# Patient Record
Sex: Female | Born: 1985 | Hispanic: Yes | Marital: Single | Smoking: Current some day smoker
Health system: Southern US, Community
[De-identification: ages and names within clinical notes are randomized; demographics above are authoritative.]

## PROBLEM LIST (undated history)

## (undated) DIAGNOSIS — Z789 Other specified health status: Secondary | ICD-10-CM

---

## 2007-06-09 HISTORY — PX: PLACEMENT OF BREAST IMPLANTS: SHX6334

## 2007-06-09 HISTORY — PX: OTHER SURGICAL HISTORY: SHX169

## 2017-01-05 ENCOUNTER — Other Ambulatory Visit: Payer: Self-pay

## 2017-01-13 ENCOUNTER — Encounter
Admission: RE | Admit: 2017-01-13 | Discharge: 2017-01-13 | Disposition: A | Discharge status: Home / Self Care | Payer: Medicaid Other | Source: Ambulatory Visit | Attending: Obstetrics and Gynecology | Admitting: Obstetrics and Gynecology

## 2017-01-13 DIAGNOSIS — F17200 Nicotine dependence, unspecified, uncomplicated: Secondary | ICD-10-CM | POA: Diagnosis not present

## 2017-01-13 DIAGNOSIS — Z9889 Other specified postprocedural states: Secondary | ICD-10-CM | POA: Diagnosis not present

## 2017-01-13 DIAGNOSIS — Z01812 Encounter for preprocedural laboratory examination: Secondary | ICD-10-CM | POA: Diagnosis not present

## 2017-01-13 DIAGNOSIS — Z79899 Other long term (current) drug therapy: Secondary | ICD-10-CM | POA: Insufficient documentation

## 2017-01-13 DIAGNOSIS — Z302 Encounter for sterilization: Secondary | ICD-10-CM | POA: Diagnosis not present

## 2017-01-13 DIAGNOSIS — Z825 Family history of asthma and other chronic lower respiratory diseases: Secondary | ICD-10-CM | POA: Insufficient documentation

## 2017-01-13 DIAGNOSIS — Z888 Allergy status to other drugs, medicaments and biological substances status: Secondary | ICD-10-CM | POA: Diagnosis not present

## 2017-01-13 DIAGNOSIS — Z8249 Family history of ischemic heart disease and other diseases of the circulatory system: Secondary | ICD-10-CM | POA: Diagnosis not present

## 2017-01-13 HISTORY — DX: Other specified health status: Z78.9

## 2017-01-13 LAB — CBC
HEMATOCRIT: 41.8 % (ref 35.0–47.0)
Hemoglobin: 14.4 g/dL (ref 12.0–16.0)
MCH: 32.3 pg (ref 26.0–34.0)
MCHC: 34.4 g/dL (ref 32.0–36.0)
MCV: 93.8 fL (ref 80.0–100.0)
Platelets: 263 10*3/uL (ref 150–440)
RBC: 4.46 MIL/uL (ref 3.80–5.20)
RDW: 13 % (ref 11.5–14.5)
WBC: 3.5 10*3/uL — AB (ref 3.6–11.0)

## 2017-01-13 LAB — BASIC METABOLIC PANEL
Anion gap: 7 (ref 5–15)
BUN: 15 mg/dL (ref 6–20)
CO2: 28 mmol/L (ref 22–32)
CREATININE: 0.98 mg/dL (ref 0.44–1.00)
Calcium: 9.7 mg/dL (ref 8.9–10.3)
Chloride: 104 mmol/L (ref 101–111)
GFR calc Af Amer: >60 mL/min (ref >60)
Glucose, Bld: 85 mg/dL (ref 65–99)
Potassium: 4.2 mmol/L (ref 3.5–5.1)
SODIUM: 139 mmol/L (ref 135–145)

## 2017-01-13 LAB — TYPE AND SCREEN
ABO/RH(D): O POS
ANTIBODY SCREEN: NEGATIVE

## 2017-01-13 NOTE — Patient Instructions (Signed)
  Your procedure is scheduled EA:VWUJWJon:Friday August 17 , 2018. Report to Same Day Surgery. To find out your arrival time please call [phone] between 1PM - 3PM on Thursday January 21, 2017.  Remember: Instructions that are not followed completely may result in serious medical risk, up to and including death, or upon the discretion of your surgeon and anesthesiologist your surgery may need to be rescheduled.    _x___ 1. Do not eat food or drink liquids after midnight. No gum chewing or hard candies.     _x___ 2. No Alcohol for 24 hours before or after surgery.   ____ 3. Bring all medications with you on the day of surgery if instructed.    __x__ 4. Notify your doctor if there is any change in your medical condition     (cold, fever, infections).    __x___ 5. No smoking 24 hours prior to surgery.     Do not wear jewelry, make-up, hairpins, clips or nail polish.  Do not wear lotions, powders, or perfumes.   Do not shave 48 hours prior to surgery. Men may shave face and neck.  Do not bring valuables to the hospital.    [facility] not responsible for any belongings or valuables.               Contacts, dentures or bridgework may not be worn into surgery.  Leave your suitcase in the car. After surgery it may be brought to your room.  For patients admitted to the hospital, discharge time is determined by your treatment team.   Patients discharged the day of surgery will not be allowed to drive home.    Please read over the following fact sheets that you were given:   [facility]  ____ Take these medicines the morning of surgery with A SIP OF WATER: NONE     ____ Fleet Enema (as directed)   _x___ Use CHG Soap as directed on instruction sheet  ____ Use inhalers on the day of surgery and bring to hospital day of surgery  ____ Stop metformin 2 days prior to surgery    ____ Take 1/2 of usual insulin dose the night before surgery and none on the morning of  surgery.   ____ Stop Coumadin/Plavix/aspirin on does not apply.  _x__ Stop Anti-inflammatories such as Advil, Aleve, Ibuprofen, Motrin, Naproxen,  Naprosyn, Goodies powders or aspirin  products. OK to take Tylenol.   ____ Stop supplements until after surgery.    ____ Bring C-Pap to the hospital.

## 2017-01-13 NOTE — H&P (Signed)
  H&P Date of Service: 01/13/2017 9:11 AM Donna Rodgers, Donna Austinhomas J, MD  Obstetrics    [] Hide copied text  [name] a 31 y.o.femalehere for Rf by Donna DoomsEmily Hedrick, NP-consult for BTL . PtG7P3 svd x 3 Desires permanent sterilization .  Was on the patch . Doesn't want IUD , Nexplanon .  Past Medical History:has no past medical history on file. Past Surgical History:has a past surgical history that includes breast implant; "tummy tuck"; and Augmentation mammaplasty. Family History:family history includes Asthma in her mother; High blood pressure (Hypertension) in her sister. Social History:reports that she has been smoking. She has never used smokeless tobacco. She reports that she drinks alcohol. She reports that she does not use drugs. OB/GYN History:         OB History   Gravida Para Term Preterm AB Living   5 3 3  2 3    SAB TAB Ectopic Molar Multiple Live Births    2    3      Allergies:is allergic to other. Medications:  Current Outpatient Prescriptions:  . diphenhydrAMINE (BENADRYL) 25 mg capsule, Take 25 mg by mouth every 6 (six) hours as needed for Itching., Disp: , Rfl:   Review of Systems: General: No fatigue or weightloss Eyes:No vision changes Ears:No hearing difficulty Respiratory:No cough or shortness of breath Pulmonary: No asthma or shortness of breath Cardiovascular:No chest pain, palpitations, dyspnea on exertion Gastrointestinal:No abdominal bloating, chronic diarrhea, constipations, masses, pain or hematochezia Genitourinary:No hematuria, dysuria, abnormal vaginal discharge, pelvic pain, Menometrorrhagia Lymphatic:No swollen lymph nodes Musculoskeletal:No muscle weakness Neurologic:No extremity weakness, syncope, seizure  disorder Psychiatric:No history of depression, delusions or suicidal/homicidal ideation   Exam:      Vitals:01/13/17  BP 102/63      Pulse: 74    Body mass index is 27.98 kg/m.  WDWN white/female in NAD  Lungs: CTA  CV: RRR without murmur   Neck: no thyromegaly Abdomen: soft , no mass, normal active bowel sounds, non-tender, no rebound tenderness, abdominoplasty scars noted  Pelvic: tanner stage 5 ,  External genitalia: vulva /labia no lesions Urethra: no prolapse Vagina: normal physiologic d/c Cervix: no lesions, no cervical motion tenderness  Uterus: normal size shape and contour, non-tender Adnexa:no mass, non-tender  Rectovaginal: no mass heme negative  Impression:   The encounter diagnosis was Encounter for sterilization.    Plan:   Benefits and risks to surgery:L/S BTL  The proposed benefit of the surgery has been discussed withthe patient. The possible risks include, butarenot limited WU:JWJXBto:organ injury to the bowel , bladder, ureters, and major blood vessels and nerves. There is a possibility of additional surgeries resulting from these injuries. There is also therisk of blood transfusion and the need to receive blood products during or after the procedure which may rarely lead to HIV or Hepatitis C infection. There is a risk of developing a deep venous thrombosis or a pulmonary embolism . There is the possibility of wound infection and also anesthetic complications, even the rare possibility of death. The patient understands these risks and wishes to proceed. All questions have been answered and the consent has been signed.      Electronically signed by Suzy BouchardSchermerhorn, Aneli Zara J, MD at 01/13/2017 12;01pm

## 2017-01-13 NOTE — H&P (Deleted)
Ms. Donna Rodgers is a 31 y.o. female here for Rf by Dr Tera Partridge BTL G2P2 s/p SVD x 2 .  Pt desires elective sterilization . Pt with no Abd/ pelvic surgery , no STP / PID . Separated , but is committed to not have additional children    Past Medical History:  has no past medical history on file.  Past Surgical History:  has no past surgical history on file. Family History: family history is not on file. Social History:  reports that she has never smoked. She has never used smokeless tobacco. She reports that she does not drink alcohol. OB/GYN History:          OB History    Gravida Para Term Preterm AB Living   2 2       2    SAB TAB Ectopic Molar Multiple Live Births             2      Allergies: has No Known Allergies. Medications: No current outpatient prescriptions on file.  Review of Systems: General:                      No fatigue or weight loss Eyes:                           No vision changes Ears:                            No hearing difficulty Respiratory:                No cough or shortness of breath Pulmonary:                  No asthma or shortness of breath Cardiovascular:           No chest pain, palpitations, dyspnea on exertion Gastrointestinal:          No abdominal bloating, chronic diarrhea, constipations, masses, pain or hematochezia Genitourinary:             No hematuria, dysuria, abnormal vaginal discharge, pelvic pain, Menometrorrhagia Lymphatic:                   No swollen lymph nodes Musculoskeletal:         No muscle weakness Neurologic:                  No extremity weakness, syncope, seizure disorder Psychiatric:                  No history of depression, delusions or suicidal/homicidal ideation    Exam:      Vitals:   11/30/16 1036  BP: (!) 129/93  Pulse: 109    Body mass index is 30.67 kg/m.  WDWN / black female in NAD   Lungs: CTA  CV : RRR without murmur   Neck:  no thyromegaly Abdomen: soft , no mass,  normal active bowel sounds,  non-tender, no rebound tenderness Pelvic: tanner stage 5 ,  External genitalia: vulva /labia no lesions Urethra: no prolapse Vagina: normal physiologic d/c Cervix: no lesions, no cervical motion tenderness   Uterus: normal size shape and contour, non-tender Adnexa: no mass,  non-tender   Rectovaginal:   Impression:   The encounter diagnosis was Encounter for sterilization.    Plan:   Benefits and risks to surgery: L/S BTL with falope rings  The proposed benefit of the surgery has been discussed with the patient. The possible risks include, but are not limited to: organ injury to the bowel , bladder, ureters, and major blood vessels and nerves. There is a possibility of additional surgeries resulting from these injuries. There is also the risk of blood transfusion and the need to receive blood products during or after the procedure which may rarely lead to HIV or Hepatitis C infection. There is a risk of developing a deep venous thrombosis or a pulmonary embolism . There is the possibility of wound infection and also anesthetic complications, even the rare possibility of death. The patient understands these risks and wishes to proceed. All questions have been answered and the consent has been signed. Failure rate 1:200 / yr    Donna PraderHOMAS JANSE SCHERMERHORN, MD

## 2017-01-13 NOTE — H&P (Signed)
Donna Rodgers is a 31 y.o. female here for Rf by [name], NP-consult for BTL . PtG7P3 svd x 3  Desires permanent sterilization .  Was on the patch . Doesn't want IUD , Nexplanon .  Past Medical History:  has no past medical history on file.  Past Surgical History:  has a past surgical history that includes breast implant; "tummy tuck"; and Augmentation mammaplasty. Family History: family history includes Asthma in her mother; High blood pressure (Hypertension) in her sister. Social History:  reports that she has been smoking.  She has never used smokeless tobacco. She reports that she drinks alcohol. She reports that she does not use drugs. OB/GYN History:          OB History    Gravida Para Term Preterm AB Living   5 3 3   2 3    SAB TAB Ectopic Molar Multiple Live Births     2       3      Allergies: is allergic to other. Medications:  Current Outpatient Prescriptions:  .  diphenhydrAMINE (BENADRYL) 25 mg capsule, Take 25 mg by mouth every 6 (six) hours as needed for Itching., Disp: , Rfl:   Review of Systems: General:                      No fatigue or weight loss Eyes:                           No vision changes Ears:                            No hearing difficulty Respiratory:                No cough or shortness of breath Pulmonary:                  No asthma or shortness of breath Cardiovascular:           No chest pain, palpitations, dyspnea on exertion Gastrointestinal:          No abdominal bloating, chronic diarrhea, constipations, masses, pain or hematochezia Genitourinary:             No hematuria, dysuria, abnormal vaginal discharge, pelvic pain, Menometrorrhagia Lymphatic:                   No swollen lymph nodes Musculoskeletal:         No muscle weakness Neurologic:                  No extremity weakness, syncope, seizure disorder Psychiatric:                  No history of depression, delusions or suicidal/homicidal ideation    Exam:     Vitals:   11/17/16 1416  BP: 102/78  Pulse: 74    Body mass index is 27.98 kg/m.  WDWN white/female in NAD   Lungs: CTA  CV : RRR without murmur    Neck:  no thyromegaly Abdomen: soft , no mass, normal active bowel sounds,  non-tender, no rebound tenderness, abdominoplasty scars noted  Pelvic: tanner stage 5 ,  External genitalia: vulva /labia no lesions Urethra: no prolapse Vagina: normal physiologic d/c Cervix: no lesions, no cervical motion tenderness   Uterus: normal size shape and contour, non-tender Adnexa: no mass,  non-tender  Rectovaginal: no mass heme negative  Impression:   The encounter diagnosis was Encounter for sterilization.    Plan:   Benefits and risks to surgery: L/S BTL  The proposed benefit of the surgery has been discussed with the patient. The possible risks include, but are not limited to: organ injury to the bowel , bladder, ureters, and major blood vessels and nerves. There is a possibility of additional surgeries resulting from these injuries. There is also the risk of blood transfusion and the need to receive blood products during or after the procedure which may rarely lead to HIV or Hepatitis C infection. There is a risk of developing a deep venous thrombosis or a pulmonary embolism . There is the possibility of wound infection and also anesthetic complications, even the rare possibility of death. The patient understands these risks and wishes to proceed. All questions have been answered and the consent has been signed.

## 2017-01-13 NOTE — OR Nursing (Signed)
Progress Notes - in this encounter  Schermerhorn, Sabas Soushomas Janse, MD - 11/17/2016 2:15 PM EDT Formatting of this note may be different from the original.  Chief Complaint:   Donna Rodgers is a 31 y.o. female here for Rf by [name], NP-consult for BTL . PtG7P3 svd x 3 Desires permanent sterilization .  Was on the patch . Doesn't want IUD , Nexplanon .  Past Medical History: has no past medical history on file.  Past Surgical History: has a past surgical history that includes breast implant; "tummy tuck"; and Augmentation mammaplasty. Family History: family history includes Asthma in her mother; High blood pressure (Hypertension) in her sister. Social History: reports that she has been smoking. She has never used smokeless tobacco. She reports that she drinks alcohol. She reports that she does not use drugs. OB/GYN History:  OB History  Gravida Para Term Preterm AB Living  5 3 3 2 3   SAB TAB Ectopic Molar Multiple Live Births  2 3    Allergies: is allergic to other. Medications:  Current Outpatient Prescriptions:  . diphenhydrAMINE (BENADRYL) 25 mg capsule, Take 25 mg by mouth every 6 (six) hours as needed for Itching., Disp: , Rfl:   Review of Systems: General: No fatigue or weight loss Eyes: No vision changes Ears: No hearing difficulty Respiratory: No cough or shortness of breath Pulmonary: No asthma or shortness of breath Cardiovascular: No chest pain, palpitations, dyspnea on exertion Gastrointestinal: No abdominal bloating, chronic diarrhea, constipations, masses, pain or hematochezia Genitourinary: No hematuria, dysuria, abnormal vaginal discharge, pelvic pain, Menometrorrhagia Lymphatic: No swollen lymph nodes Musculoskeletal: No muscle weakness Neurologic: No extremity weakness, syncope, seizure disorder Psychiatric: No history of depression, delusions or suicidal/homicidal ideation  Exam:   Vitals:  11/17/16 1416  BP: 102/78  Pulse: 74   Body mass index  is 27.98 kg/m.  WDWN white/female in NAD  Lungs: CTA  CV : RRR without murmur   Neck: no thyromegaly Abdomen: soft , no mass, normal active bowel sounds, non-tender, no rebound tenderness, abdominoplasty scars noted  Pelvic: tanner stage 5 ,  External genitalia: vulva /labia no lesions Urethra: no prolapse Vagina: normal physiologic d/c Cervix: no lesions, no cervical motion tenderness  Uterus: normal size shape and contour, non-tender Adnexa: no mass, non-tender  Rectovaginal: no mass heme negative  Impression:   The encounter diagnosis was Encounter for sterilization.  Plan:   Benefits and risks to surgery: L/S BTL  The proposed benefit of the surgery has been discussed with the patient. The possible risks include, but are not limited to: organ injury to the bowel , bladder, ureters, and major blood vessels and nerves. There is a possibility of additional surgeries resulting from these injuries. There is also the risk of blood transfusion and the need to receive blood products during or after the procedure which may rarely lead to HIV or Hepatitis C infection. There is a risk of developing a deep venous thrombosis or a pulmonary embolism . There is the possibility of wound infection and also anesthetic complications, even the rare possibility of death. The patient understands these risks and wishes to proceed. All questions have been answered and the consent has been signed.  No orders of the defined types were placed in this encounter.  Return if symptoms worsen or fail to improve, for preop.  Vilma PraderHOMAS JANSE SCHERMERHORN, MD

## 2017-01-22 ENCOUNTER — Ambulatory Visit: Payer: Medicaid Other | Admitting: Anesthesiology

## 2017-01-22 ENCOUNTER — Ambulatory Visit
Admission: RE | Admit: 2017-01-22 | Discharge: 2017-01-22 | Disposition: A | Discharge status: Home / Self Care | Payer: Medicaid Other | Source: Ambulatory Visit | Attending: Obstetrics and Gynecology | Admitting: Obstetrics and Gynecology

## 2017-01-22 ENCOUNTER — Encounter
Admission: RE | Disposition: A | Discharge status: Home / Self Care | Payer: Self-pay | Source: Ambulatory Visit | Attending: Obstetrics and Gynecology

## 2017-01-22 DIAGNOSIS — Z302 Encounter for sterilization: Secondary | ICD-10-CM | POA: Insufficient documentation

## 2017-01-22 DIAGNOSIS — Z825 Family history of asthma and other chronic lower respiratory diseases: Secondary | ICD-10-CM | POA: Insufficient documentation

## 2017-01-22 DIAGNOSIS — Z8249 Family history of ischemic heart disease and other diseases of the circulatory system: Secondary | ICD-10-CM | POA: Insufficient documentation

## 2017-01-22 DIAGNOSIS — F17200 Nicotine dependence, unspecified, uncomplicated: Secondary | ICD-10-CM | POA: Insufficient documentation

## 2017-01-22 DIAGNOSIS — Z9889 Other specified postprocedural states: Secondary | ICD-10-CM | POA: Diagnosis not present

## 2017-01-22 DIAGNOSIS — Z9882 Breast implant status: Secondary | ICD-10-CM | POA: Diagnosis not present

## 2017-01-22 HISTORY — PX: LAPAROSCOPIC TUBAL LIGATION: SHX1937

## 2017-01-22 LAB — POCT PREGNANCY, URINE
PREG TEST UR: NEGATIVE
Preg Test, Ur: NEGATIVE

## 2017-01-22 LAB — ABO/RH: ABO/RH(D): O POS

## 2017-01-22 SURGERY — LIGATION, FALLOPIAN TUBE, LAPAROSCOPIC
Anesthesia: General | Laterality: Bilateral

## 2017-01-22 MED ORDER — MIDAZOLAM HCL 2 MG/2ML IJ SOLN
INTRAMUSCULAR | Status: AC
  Filled 2017-01-22: qty 2

## 2017-01-22 MED ORDER — KETOROLAC TROMETHAMINE 30 MG/ML IJ SOLN
INTRAMUSCULAR | Status: DC | PRN
  Administered 2017-01-22: 30 mg via INTRAVENOUS

## 2017-01-22 MED ORDER — HYDROCODONE-ACETAMINOPHEN 5-325 MG PO TABS
1.0000 | ORAL_TABLET | Freq: Once | ORAL | Status: AC
  Administered 2017-01-22: 1 via ORAL

## 2017-01-22 MED ORDER — ROCURONIUM BROMIDE 100 MG/10ML IV SOLN
INTRAVENOUS | Status: DC | PRN
  Administered 2017-01-22: 30 mg via INTRAVENOUS

## 2017-01-22 MED ORDER — FAMOTIDINE 20 MG PO TABS
20.0000 mg | ORAL_TABLET | Freq: Once | ORAL | Status: AC
  Administered 2017-01-22: 20 mg via ORAL

## 2017-01-22 MED ORDER — DEXAMETHASONE SODIUM PHOSPHATE 10 MG/ML IJ SOLN
INTRAMUSCULAR | Status: DC | PRN
  Administered 2017-01-22: 10 mg via INTRAVENOUS

## 2017-01-22 MED ORDER — ONDANSETRON HCL 4 MG/2ML IJ SOLN
INTRAMUSCULAR | Status: DC | PRN
  Administered 2017-01-22: 4 mg via INTRAVENOUS

## 2017-01-22 MED ORDER — FAMOTIDINE 20 MG PO TABS
ORAL_TABLET | ORAL | Status: AC
  Filled 2017-01-22: qty 1

## 2017-01-22 MED ORDER — HYDROCODONE-ACETAMINOPHEN 5-325 MG PO TABS
ORAL_TABLET | ORAL | Status: AC
  Filled 2017-01-22: qty 1

## 2017-01-22 MED ORDER — SUGAMMADEX SODIUM 200 MG/2ML IV SOLN
INTRAVENOUS | Status: DC | PRN
  Administered 2017-01-22: 200 mg via INTRAVENOUS

## 2017-01-22 MED ORDER — BUPIVACAINE HCL (PF) 0.5 % IJ SOLN
INTRAMUSCULAR | Status: AC
  Filled 2017-01-22: qty 30

## 2017-01-22 MED ORDER — BUPIVACAINE HCL 0.5 % IJ SOLN
INTRAMUSCULAR | Status: DC | PRN
  Administered 2017-01-22: 10 mL

## 2017-01-22 MED ORDER — SILVER NITRATE-POT NITRATE 75-25 % EX MISC
CUTANEOUS | Status: AC
  Filled 2017-01-22: qty 4

## 2017-01-22 MED ORDER — MIDAZOLAM HCL 2 MG/2ML IJ SOLN
INTRAMUSCULAR | Status: DC | PRN
  Administered 2017-01-22: 2 mg via INTRAVENOUS

## 2017-01-22 MED ORDER — FENTANYL CITRATE (PF) 100 MCG/2ML IJ SOLN
INTRAMUSCULAR | Status: AC
  Filled 2017-01-22: qty 2

## 2017-01-22 MED ORDER — LACTATED RINGERS IV SOLN
INTRAVENOUS | Status: DC
  Administered 2017-01-22 (×2): via INTRAVENOUS

## 2017-01-22 MED ORDER — PROPOFOL 10 MG/ML IV BOLUS
INTRAVENOUS | Status: DC | PRN
  Administered 2017-01-22: 160 mg via INTRAVENOUS

## 2017-01-22 MED ORDER — FENTANYL CITRATE (PF) 100 MCG/2ML IJ SOLN
25.0000 ug | INTRAMUSCULAR | Status: AC | PRN
  Administered 2017-01-22 (×6): 25 ug via INTRAVENOUS

## 2017-01-22 MED ORDER — LIDOCAINE HCL (CARDIAC) 20 MG/ML IV SOLN
INTRAVENOUS | Status: DC | PRN
  Administered 2017-01-22: 100 mg via INTRAVENOUS

## 2017-01-22 MED ORDER — FENTANYL CITRATE (PF) 100 MCG/2ML IJ SOLN
INTRAMUSCULAR | Status: DC | PRN
  Administered 2017-01-22 (×2): 50 ug via INTRAVENOUS

## 2017-01-22 MED ORDER — ONDANSETRON HCL 4 MG/2ML IJ SOLN: 4.0000 mg | Freq: Once | INTRAMUSCULAR | Status: DC | PRN

## 2017-01-22 MED ORDER — FENTANYL CITRATE (PF) 100 MCG/2ML IJ SOLN
INTRAMUSCULAR | Status: AC
  Administered 2017-01-22: 25 ug via INTRAVENOUS
  Filled 2017-01-22: qty 2

## 2017-01-22 SURGICAL SUPPLY — 27 items
BLADE SURG SZ11 CARB STEEL (BLADE) ×3 IMPLANT
CATH ROBINSON RED A/P 16FR (CATHETERS) ×3 IMPLANT
CLOSURE WOUND 1/2 X4 (GAUZE/BANDAGES/DRESSINGS)
CLOSURE WOUND 1/4X4 (GAUZE/BANDAGES/DRESSINGS)
GLOVE BIO SURGEON STRL SZ8 (GLOVE) ×3 IMPLANT
GOWN STRL REUS W/ TWL LRG LVL3 (GOWN DISPOSABLE) ×1 IMPLANT
GOWN STRL REUS W/ TWL XL LVL3 (GOWN DISPOSABLE) ×1 IMPLANT
GOWN STRL REUS W/TWL LRG LVL3 (GOWN DISPOSABLE) ×2
GOWN STRL REUS W/TWL XL LVL3 (GOWN DISPOSABLE) ×2
KIT DISPOSABLE FALLOPE RING (Ring) ×3 IMPLANT
KIT RM TURNOVER CYSTO AR (KITS) ×3 IMPLANT
LABEL OR SOLS (LABEL) ×3 IMPLANT
NS IRRIG 500ML POUR BTL (IV SOLUTION) ×3 IMPLANT
PACK GYN LAPAROSCOPIC (MISCELLANEOUS) ×3 IMPLANT
PAD OB MATERNITY 4.3X12.25 (PERSONAL CARE ITEMS) ×3 IMPLANT
PAD PREP 24X41 OB/GYN DISP (PERSONAL CARE ITEMS) ×3 IMPLANT
SHEARS HARMONIC ACE PLUS 36CM (ENDOMECHANICALS) IMPLANT
STRIP CLOSURE SKIN 1/2X4 (GAUZE/BANDAGES/DRESSINGS) IMPLANT
STRIP CLOSURE SKIN 1/4X4 (GAUZE/BANDAGES/DRESSINGS) IMPLANT
SUT VIC AB 0 CT1 36 (SUTURE) IMPLANT
SUT VIC AB 2-0 UR6 27 (SUTURE) IMPLANT
SUT VIC AB 4-0 SH 27 (SUTURE) ×2
SUT VIC AB 4-0 SH 27XANBCTRL (SUTURE) ×1 IMPLANT
SWABSTK COMLB BENZOIN TINCTURE (MISCELLANEOUS) ×3 IMPLANT
TROCAR ENDO BLADELESS 11MM (ENDOMECHANICALS) IMPLANT
TROCAR XCEL NON-BLD 5MMX100MML (ENDOMECHANICALS) ×3 IMPLANT
TUBING INSUFFLATOR HI FLOW (MISCELLANEOUS) ×3 IMPLANT

## 2017-01-22 NOTE — Anesthesia Postprocedure Evaluation (Signed)
Anesthesia Post Note  Patient: Donna Rodgers  Procedure(s) Performed: Procedure(s) (LRB): LAPAROSCOPIC TUBAL LIGATION (Bilateral)  Patient location during evaluation: PACU Anesthesia Type: General Level of consciousness: awake and alert Pain management: pain level controlled Vital Signs Assessment: post-procedure vital signs reviewed and stable Respiratory status: spontaneous breathing and respiratory function stable Cardiovascular status: stable Anesthetic complications: no     Last Vitals:  Vitals:   01/22/17 1020 01/22/17 1025  BP:    Pulse: 96 87  Resp: 11 12  Temp:    SpO2: 97% 100%    Last Pain:  Vitals:   01/22/17 1025  TempSrc:   PainSc: 5                  KEPHART,WILLIAM K

## 2017-01-22 NOTE — Brief Op Note (Signed)
01/22/2017  9:58 AM  PATIENT:  Donna Rodgers  31 y.o. female  PRE-OPERATIVE DIAGNOSIS:  Elective Sterilization  POST-OPERATIVE DIAGNOSIS:  Elective Sterilization  PROCEDURE:  Procedure(s): LAPAROSCOPIC TUBAL LIGATION (Bilateral)  SURGEON:  Surgeon(s) and Role:    * Janeisha Ryle, Ihor Austin, MD - Primary  PHYSICIAN ASSISTANT:   ASSISTANTS: none   ANESTHESIA:   general  EBL:  Total I/O In: 800 [I.V.:800] Out: 152 [Urine:150; Blood:2]  BLOOD ADMINISTERED:none  DRAINS: none   LOCAL MEDICATIONS USED:  MARCAINE     SPECIMEN:  No Specimen  DISPOSITION OF SPECIMEN:  N/A  COUNTS:  YES  TOURNIQUET:  * No tourniquets in log *  DICTATION: .Other Dictation: Dictation Number verbal  PLAN OF CARE: Discharge to home after PACU  PATIENT DISPOSITION:  PACU - hemodynamically stable.   Delay start of Pharmacological VTE agent (>24hrs) due to surgical blood loss or risk of bleeding: not applicable

## 2017-01-22 NOTE — Progress Notes (Signed)
Pt states pain is a little better and wants to go home

## 2017-01-22 NOTE — Anesthesia Post-op Follow-up Note (Signed)
Anesthesia QCDR form completed.        

## 2017-01-22 NOTE — Progress Notes (Signed)
Pt ready for BTL  Via L/S . NPO / all questions answered  Proceed neg HCG

## 2017-01-22 NOTE — Anesthesia Preprocedure Evaluation (Signed)
Anesthesia Evaluation  Patient identified by MRN, date of birth, ID band Patient awake    Reviewed: Allergy & Precautions, NPO status , Patient's Chart, lab work & pertinent test results  History of Anesthesia Complications Negative for: history of anesthetic complications  Airway Mallampati: II       Dental   Pulmonary neg COPD, Current Smoker,           Cardiovascular (-) hypertension(-) Past MI and (-) CHF (-) dysrhythmias (-) Valvular Problems/Murmurs     Neuro/Psych neg Seizures    GI/Hepatic Neg liver ROS, neg GERD  ,  Endo/Other  neg diabetes  Renal/GU negative Renal ROS     Musculoskeletal   Abdominal   Peds  Hematology   Anesthesia Other Findings   Reproductive/Obstetrics                             Anesthesia Physical Anesthesia Plan  ASA: II  Anesthesia Plan: General   Post-op Pain Management:    Induction: Intravenous  PONV Risk Score and Plan: 2 and Ondansetron and Dexamethasone  Airway Management Planned: Oral ETT  Additional Equipment:   Intra-op Plan:   Post-operative Plan:   Informed Consent: I have reviewed the patients History and Physical, chart, labs and discussed the procedure including the risks, benefits and alternatives for the proposed anesthesia with the patient or authorized representative who has indicated his/her understanding and acceptance.     Plan Discussed with:   Anesthesia Plan Comments:         Anesthesia Quick Evaluation

## 2017-01-22 NOTE — Transfer of Care (Signed)
Immediate Anesthesia Transfer of Care Note  Patient: Donna Rodgers  Procedure(s) Performed: Procedure(s): LAPAROSCOPIC TUBAL LIGATION (Bilateral)  Patient Location: PACU  Anesthesia Type:General  Level of Consciousness: sedated  Airway & Oxygen Therapy: Patient Spontanous Breathing and Patient connected to face mask oxygen  Post-op Assessment: Report given to RN and Post -op Vital signs reviewed and stable  Post vital signs: Reviewed and stable  Last Vitals:  Vitals:   01/22/17 0802  BP: 118/77  Pulse: 86  Resp: 16  Temp: (!) 35.9 C  SpO2: 100%    Last Pain:  Vitals:   01/22/17 0802  TempSrc: Tympanic         Complications: No apparent anesthesia complications

## 2017-01-22 NOTE — Anesthesia Procedure Notes (Signed)
Procedure Name: Intubation Date/Time: 01/22/2017 9:15 AM Performed by: Justus Memory Pre-anesthesia Checklist: Patient identified, Patient being monitored, Timeout performed, Emergency Drugs available and Suction available Patient Re-evaluated:Patient Re-evaluated prior to induction Oxygen Delivery Method: Circle system utilized Preoxygenation: Pre-oxygenation with 100% oxygen Induction Type: IV induction Ventilation: Mask ventilation without difficulty Laryngoscope Size: Mac and 3 Grade View: Grade I Tube type: Oral Tube size: 7.0 mm Number of attempts: 1 Airway Equipment and Method: Stylet Placement Confirmation: ETT inserted through vocal cords under direct vision,  positive ETCO2 and breath sounds checked- equal and bilateral Secured at: 21 cm Tube secured with: Tape Dental Injury: Teeth and Oropharynx as per pre-operative assessment

## 2017-01-25 NOTE — Op Note (Signed)
NAME:  Donna Rodgers, MEGEE NO.:  [id number]  MEDICAL RECORD NO.:  [id number]  LOCATION:                                 FACILITY:  PHYSICIAN:  Jennell Corner, MD     DATE OF BIRTH:  DATE OF PROCEDURE:  01/22/2017 DATE OF DISCHARGE:                              OPERATIVE REPORT   PREOPERATIVE DIAGNOSIS:  Elective permanent sterilization.  POSTOPERATIVE DIAGNOSIS:  Elective permanent sterilization.  PROCEDURE PERFORMED:  Laparoscopic bilateral tubal ligation, Falope rings.  SURGEON:  Jennell Corner, MD  ANESTHESIA:  General endotracheal anesthesia.  SURGEON:  Jennell Corner, MD.  INDICATION:  A 31 year old, gravida 5, para 3 patient, who has elected for permanent sterilization.  DESCRIPTION OF PROCEDURE:  After adequate general endotracheal anesthesia, the patient was placed in dorsal supine position.  The patient's abdomen, perineum, and vagina were prepped and draped in normal sterile fashion.  Time-out was performed.  Straight catheterization of the bladder yielded 150 mL clear urine.  Sponge stick was placed in the vagina and used for uterine manipulation during the procedure.  Gloves were changed.  A 7 mm infraumbilical incision was made after injected with 0.5% Marcaine.  The 5 mm laparoscope was advanced into the abdominal cavity under direct visualization with the Optiview cannula.  Once gaining entrance into the peritoneal cavity, the patient's abdomen was insufflated with carbon dioxide.  A second port was placed 2 cm above the symphysis pubis centrally after injected with 0.5% Marcaine.  The 7 mm Falope ring trocar was advanced under direct visualization.  Attention was directed to the patient's right fallopian tube, which was identified.  The fimbria was identified as well.  The Falope ring was applied at the isthmic ampullary portion of the fallopian tube and with resulting 1 cm knuckle of fallopian tube. Similar procedure was  repeated on the patient's left fallopian tube. The Falope ring was applied at the isthmic ampullary portion and a 1 cm knuckle of fallopian tube resulted.  Pictures were taken.  Good hemostasis was noted.  The patient's abdomen was deflated and the cannulas were removed.  Each incision was closed with interrupted 4-0 Vicryl suture.  Sterile dressing applied.  Vaginal sponge was removed as well.  There were no complications.  ESTIMATED BLOOD LOSS:  Minimal.  INTRAOPERATIVE FLUIDS:  800 mL.  URINE OUTPUT:  150 mL.  The patient was taken to recovery room in good condition.    ______________________________ Jennell Corner, MD   ______________________________ Jennell Corner, MD    TS/MEDQ  D:  01/22/2017  T:  01/22/2017  Job:  [id number]

## 2017-01-26 ENCOUNTER — Emergency Department: Payer: Self-pay

## 2017-01-26 ENCOUNTER — Emergency Department
Admission: EM | Admit: 2017-01-26 | Discharge: 2017-01-26 | Disposition: A | Discharge status: Home / Self Care | Payer: Self-pay | Attending: Emergency Medicine | Admitting: Emergency Medicine

## 2017-01-26 ENCOUNTER — Encounter: Payer: Self-pay | Admitting: Emergency Medicine

## 2017-01-26 DIAGNOSIS — Z79899 Other long term (current) drug therapy: Secondary | ICD-10-CM | POA: Insufficient documentation

## 2017-01-26 DIAGNOSIS — R0781 Pleurodynia: Secondary | ICD-10-CM | POA: Insufficient documentation

## 2017-01-26 DIAGNOSIS — F17210 Nicotine dependence, cigarettes, uncomplicated: Secondary | ICD-10-CM | POA: Insufficient documentation

## 2017-01-26 DIAGNOSIS — G8918 Other acute postprocedural pain: Secondary | ICD-10-CM | POA: Insufficient documentation

## 2017-01-26 LAB — TROPONIN I

## 2017-01-26 LAB — CBC
HCT: 43.4 % (ref 35.0–47.0)
Hemoglobin: 15 g/dL (ref 12.0–16.0)
MCH: 31.8 pg (ref 26.0–34.0)
MCHC: 34.6 g/dL (ref 32.0–36.0)
MCV: 91.7 fL (ref 80.0–100.0)
PLATELETS: 262 10*3/uL (ref 150–440)
RBC: 4.73 MIL/uL (ref 3.80–5.20)
RDW: 12.8 % (ref 11.5–14.5)
WBC: 6.1 10*3/uL (ref 3.6–11.0)

## 2017-01-26 LAB — BASIC METABOLIC PANEL
Anion gap: 8 (ref 5–15)
BUN: 10 mg/dL (ref 6–20)
CALCIUM: 9.9 mg/dL (ref 8.9–10.3)
CHLORIDE: 103 mmol/L (ref 101–111)
CO2: 27 mmol/L (ref 22–32)
CREATININE: 0.7 mg/dL (ref 0.44–1.00)
GFR calc non Af Amer: >60 mL/min (ref >60)
Glucose, Bld: 95 mg/dL (ref 65–99)
Potassium: 3.9 mmol/L (ref 3.5–5.1)
SODIUM: 138 mmol/L (ref 135–145)

## 2017-01-26 MED ORDER — IOPAMIDOL (ISOVUE-370) INJECTION 76%
75.0000 mL | Freq: Once | INTRAVENOUS | Status: AC | PRN
  Administered 2017-01-26: 75 mL via INTRAVENOUS

## 2017-01-26 NOTE — ED Provider Notes (Signed)
[facility] Emergency Department Provider Note  ____________________________________________  Time seen: Approximately 6:16 PM  I have reviewed the triage vital signs and the nursing notes.   HISTORY  Chief Complaint Chest Pain    HPI Donna Rodgers is a 31 y.o. female who had laparoscopic BTL 4 days ago, and 3 days ago had onset of pleuritic central chest pain, occasionally radiating through to her back. Associated with shortness of breath. Worse with breathing. Not exertional. No cough fever or chills. Occasional diaphoresis. No vomiting. No alleviating factors. Moderate intensity. Sharp. Never had anything like this before.     Past Medical History:  Diagnosis Date  . Medical history non-contributory   None   There are no active problems to display for this patient.    Past Surgical History:  Procedure Laterality Date  . LAPAROSCOPIC TUBAL LIGATION Bilateral 01/22/2017   Procedure: LAPAROSCOPIC TUBAL LIGATION;  Surgeon: Schermerhorn, Ihor Austin, MD;  Location: [facility] ORS;  Service: Gynecology;  Laterality: Bilateral;  . PLACEMENT OF BREAST IMPLANTS Bilateral 2009  . tummy tuck  2009     Prior to Admission medications   Medication Sig Start Date End Date Taking? Authorizing Provider  cetirizine (ZYRTEC) 10 MG tablet Take 10 mg by mouth daily as needed for allergies.    [provider]     Allergies Patient has no known allergies.   No family history on file.  Social History Social History  Substance Use Topics  . Smoking status: Current Some Day Smoker  . Smokeless tobacco: Never Used     Comment: Hookah every day  . Alcohol use No     Comment: 1 mixed drink per month    Review of Systems  Constitutional:   No fever or chills.  ENT:   No sore throat. No rhinorrhea. Cardiovascular:   Positive as above chest pain without syncope. Respiratory:   Positive shortness of breath without cough. Gastrointestinal:   Positive mild  generalized abdominal pain, improving. No vomiting diarrhea or constipation.  Musculoskeletal:   Negative for focal pain or swelling All other systems reviewed and are negative except as documented above in ROS and HPI.  ____________________________________________   PHYSICAL EXAM:  VITAL SIGNS: ED Triage Vitals  Enc Vitals Group     BP 01/26/17 1244 113/74     Pulse Rate 01/26/17 1244 87     Resp 01/26/17 1244 16     Temp 01/26/17 1244 98.7 F (37.1 C)     Temp Source 01/26/17 1244 Oral     SpO2 01/26/17 1244 99 %     Weight 01/26/17 1244 160 lb (72.6 kg)     Height 01/26/17 1244 5\' 4"  (1.626 m)     Head Circumference --      Peak Flow --      Pain Score 01/26/17 1547 7     Pain Loc --      Pain Edu? --      Excl. in GC? --     Vital signs reviewed, nursing assessments reviewed.   Constitutional:   Alert and oriented. Well appearing and in no distress. Eyes:   No scleral icterus.  EOMI. No nystagmus. No conjunctival pallor. PERRL. ENT   Head:   Normocephalic and atraumatic.   Nose:   No congestion/rhinnorhea.    Mouth/Throat:   MMM, no pharyngeal erythema. No peritonsillar mass.    Neck:   No meningismus. Full ROM Hematological/Lymphatic/Immunilogical:   No cervical lymphadenopathy. Cardiovascular:   RRR. Symmetric  bilateral radial and DP pulses.  No murmurs.  Respiratory:   Normal respiratory effort without tachypnea/retractions. Breath sounds are clear and equal bilaterally. No wheezes/rales/rhonchi. Gastrointestinal:   Soft and nontender. Non distended. There is no CVA tenderness.  No rebound, rigidity, or guarding. Genitourinary:   deferred Musculoskeletal:   Normal range of motion in all extremities. No joint effusions.  No lower extremity tenderness.  No edema.Chest wall nontender. Neurologic:   Normal speech and language.  Motor grossly intact. No gross focal neurologic deficits are appreciated.  Skin:    Skin is warm, dry and intact. Laparoscopic  surgical incision at the umbilicus and suprapubic area well healed.  ____________________________________________    LABS (pertinent positives/negatives) (all labs ordered are listed, but only abnormal results are displayed) Labs Reviewed  BASIC METABOLIC PANEL  CBC  TROPONIN I   ____________________________________________   EKG  Interpreted by me Normal sinus rhythm rate of 70, normal axis and intervals. Normal QRS ST segments and T waves. No evidence of right heart strain.  ____________________________________________    RADIOLOGY  Dg Chest 2 View  Result Date: 01/26/2017 CLINICAL DATA:  Chest pain, shortness of breath EXAM: CHEST  2 VIEW COMPARISON:  None. FINDINGS: There is no focal parenchymal opacity. There is no pleural effusion or pneumothorax. The heart and mediastinal contours are unremarkable. There is a linear lucency under the left hemidiaphragm. The osseous structures are unremarkable. IMPRESSION: Linear lucency under the left hemidiaphragm likely postsurgical given recent tubal ligation on 01/22/2017. No active cardiopulmonary disease. These results were called by telephone at the time of interpretation on 01/26/2017 at 1:46 pm to Dr. Daryel November , who verbally acknowledged these results. Electronically Signed   By: Elige Ko   On: 01/26/2017 13:47   Ct Angio Chest Pe W And/or Wo Contrast  Result Date: 01/26/2017 CLINICAL DATA:  Recent laparoscopic tubal ligation. Chest pain and shortness of breath EXAM: CT ANGIOGRAPHY CHEST WITH CONTRAST TECHNIQUE: Multidetector CT imaging of the chest was performed using the standard protocol during bolus administration of intravenous contrast. Multiplanar CT image reconstructions and MIPs were obtained to evaluate the vascular anatomy. CONTRAST:  75 mL Isovue 370 nonionic COMPARISON:  Chest radiograph January 26, 2017 FINDINGS: Cardiovascular: There is no demonstrable pulmonary embolus. There is no thoracic aortic aneurysm or  dissection. Visualized great vessels appear normal. Pericardium is not appreciably thickened. Mediastinum/Nodes: Thyroid appears unremarkable. There is no appreciable thoracic adenopathy. There is mild air in the distal esophagus. There does not appear to be thickening of the esophageal wall. Lungs/Pleura: Lungs are clear. No pleural effusion or pleural thickening. Upper Abdomen: In the visualized upper abdomen, there is slight reflux of contrast into the inferior vena cava. This contrast does not extend into the hepatic veins. There are foci of pneumoperitoneum. Visualized upper abdominal structures otherwise appear unremarkable. Musculoskeletal: There are breast implants bilaterally. There are no blastic or lytic bone lesions. Review of the MIP images confirms the above findings. IMPRESSION: 1.  No demonstrable pulmonary embolus. 2.  Lungs clear. 3. Pneumoperitoneum. This pneumoperitoneum is most likely of postoperative etiology. Patient should be watched closely clinically in this regard given that this role perforation cannot be entirely excluded in this circumstance. 4.  No appreciable adenopathy. 5. Modest reflux of contrast into the inferior vena cava. Significance of this modest reflux uncertain. It potentially could indicate a degree of increased right heart pressure. Electronically Signed   By: Bretta Bang III M.D.   On: 01/26/2017 17:44    ____________________________________________  PROCEDURES Procedures  ____________________________________________   INITIAL IMPRESSION / ASSESSMENT AND PLAN / ED COURSE  Pertinent labs & imaging results that were available during my care of the patient were reviewed by me and considered in my medical decision making (see chart for details).  Patient presents with pleuritic chest pain, postoperative related to pneumoperitoneum and diaphragmatic irritation and mechanics of intubation versus PE. Labs unremarkable, CT angiogram negative. Vital signs  normal. Patient not in distress with normal work of breathing. Discharge home to follow up with primary care and surgery as needed.Considering the patient's symptoms, medical history, and physical examination today, I have low suspicion for ACS, PE, TAD, pneumothorax, carditis, mediastinitis, pneumonia, CHF, or sepsis.        ____________________________________________   FINAL CLINICAL IMPRESSION(S) / ED DIAGNOSES  Final diagnoses:  Pleuritic chest pain  Post-operative pain      New Prescriptions   No medications on file     Portions of this note were generated with dragon dictation software. Dictation errors may occur despite best attempts at proofreading.    Sharman Cheek, MD 01/26/17 Zollie Pee

## 2017-01-26 NOTE — Discharge Instructions (Signed)
Your tests today were unremarkable. You do not have a blood clot in your lungs.    Results for orders placed or performed during the hospital encounter of 01/26/17  Basic metabolic panel  Result Value Ref Range   Sodium 138 135 - 145 mmol/L   Potassium 3.9 3.5 - 5.1 mmol/L   Chloride 103 101 - 111 mmol/L   CO2 27 22 - 32 mmol/L   Glucose, Bld 95 65 - 99 mg/dL   BUN 10 6 - 20 mg/dL   Creatinine, Ser 1.77 0.44 - 1.00 mg/dL   Calcium 9.9 8.9 - 93.9 mg/dL   GFR calc non Af Amer >60 >60 mL/min   GFR calc Af Amer >60 >60 mL/min   Anion gap 8 5 - 15  CBC  Result Value Ref Range   WBC 6.1 3.6 - 11.0 K/uL   RBC 4.73 3.80 - 5.20 MIL/uL   Hemoglobin 15.0 12.0 - 16.0 g/dL   HCT 03.0 09.2 - 33.0 %   MCV 91.7 80.0 - 100.0 fL   MCH 31.8 26.0 - 34.0 pg   MCHC 34.6 32.0 - 36.0 g/dL   RDW 07.6 22.6 - 33.3 %   Platelets 262 150 - 440 K/uL  Troponin I  Result Value Ref Range   Troponin I <0.03 <0.03 ng/mL   Dg Chest 2 View  Result Date: 01/26/2017 CLINICAL DATA:  Chest pain, shortness of breath EXAM: CHEST  2 VIEW COMPARISON:  None. FINDINGS: There is no focal parenchymal opacity. There is no pleural effusion or pneumothorax. The heart and mediastinal contours are unremarkable. There is a linear lucency under the left hemidiaphragm. The osseous structures are unremarkable. IMPRESSION: Linear lucency under the left hemidiaphragm likely postsurgical given recent tubal ligation on 01/22/2017. No active cardiopulmonary disease. These results were called by telephone at the time of interpretation on 01/26/2017 at 1:46 pm to Dr. Daryel November , who verbally acknowledged these results. Electronically Signed   By: Elige Ko   On: 01/26/2017 13:47   Ct Angio Chest Pe W And/or Wo Contrast  Result Date: 01/26/2017 CLINICAL DATA:  Recent laparoscopic tubal ligation. Chest pain and shortness of breath EXAM: CT ANGIOGRAPHY CHEST WITH CONTRAST TECHNIQUE: Multidetector CT imaging of the chest was performed  using the standard protocol during bolus administration of intravenous contrast. Multiplanar CT image reconstructions and MIPs were obtained to evaluate the vascular anatomy. CONTRAST:  75 mL Isovue 370 nonionic COMPARISON:  Chest radiograph January 26, 2017 FINDINGS: Cardiovascular: There is no demonstrable pulmonary embolus. There is no thoracic aortic aneurysm or dissection. Visualized great vessels appear normal. Pericardium is not appreciably thickened. Mediastinum/Nodes: Thyroid appears unremarkable. There is no appreciable thoracic adenopathy. There is mild air in the distal esophagus. There does not appear to be thickening of the esophageal wall. Lungs/Pleura: Lungs are clear. No pleural effusion or pleural thickening. Upper Abdomen: In the visualized upper abdomen, there is slight reflux of contrast into the inferior vena cava. This contrast does not extend into the hepatic veins. There are foci of pneumoperitoneum. Visualized upper abdominal structures otherwise appear unremarkable. Musculoskeletal: There are breast implants bilaterally. There are no blastic or lytic bone lesions. Review of the MIP images confirms the above findings. IMPRESSION: 1.  No demonstrable pulmonary embolus. 2.  Lungs clear. 3. Pneumoperitoneum. This pneumoperitoneum is most likely of postoperative etiology. Patient should be watched closely clinically in this regard given that this role perforation cannot be entirely excluded in this circumstance. 4.  No appreciable adenopathy. 5.  Modest reflux of contrast into the inferior vena cava. Significance of this modest reflux uncertain. It potentially could indicate a degree of increased right heart pressure. Electronically Signed   By: Bretta Bang III M.D.   On: 01/26/2017 17:44

## 2017-01-26 NOTE — ED Triage Notes (Signed)
Pt to ED via pov with c/o CP and SOB that started after her tubal ligation on 8/17. Pt A&OX4, NAD noted. MD Williams eval ekg and protocols only at this time.

## 2019-08-21 IMAGING — CT CT ANGIO CHEST
2 of 6 series · 18 of 46 positions shown · IV contrast (APPLIED)
Comparison: Chest radiograph January 26, 2017

CLINICAL DATA: Recent laparoscopic tubal ligation. Chest pain and
shortness of breath

EXAM:
CT ANGIOGRAPHY CHEST WITH CONTRAST
TECHNIQUE: Multidetector CT imaging of the chest was performed using the
standard protocol during bolus administration of intravenous
contrast. Multiplanar CT image reconstructions and MIPs were
obtained to evaluate the vascular anatomy.
CONTRAST:  75 mL Isovue 370 nonionic

[Series 5: thins · axial · 0.64mm/px · z∈[-264,-32]mm · 16 of 256 slices shown]
[im 12/256  lung]
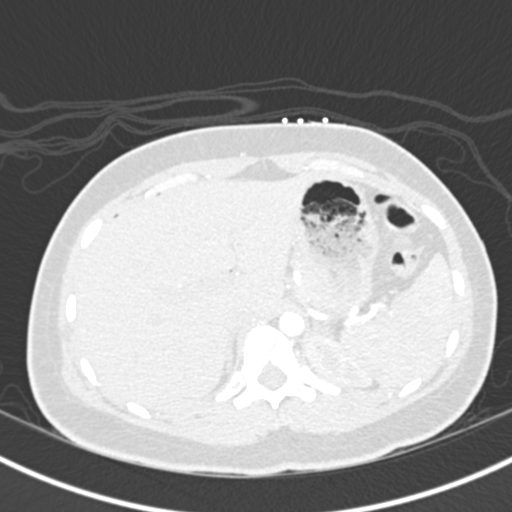
[im 34/256  soft-tissue]
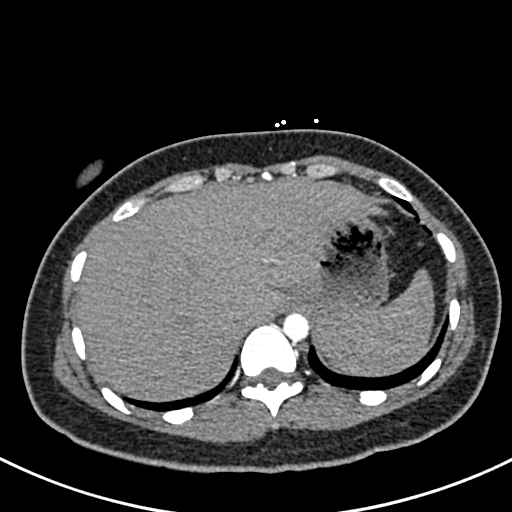
[im 45/256  lung]
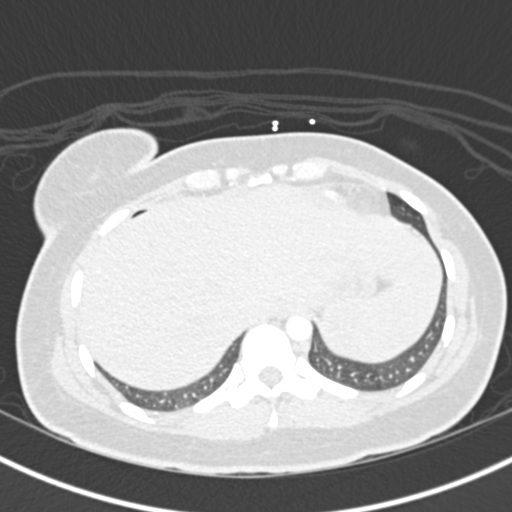
[im 56/256  soft-tissue]
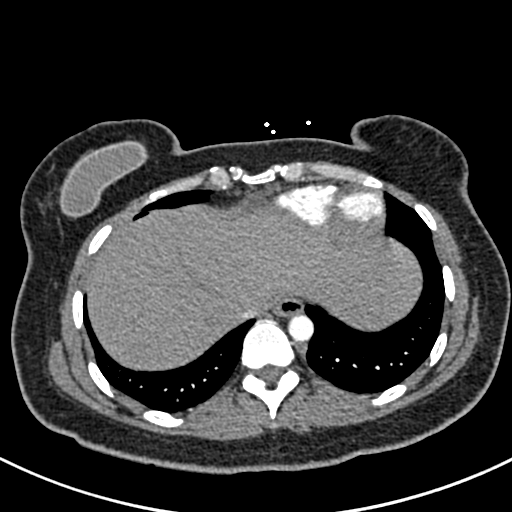
[im 78/256  lung]
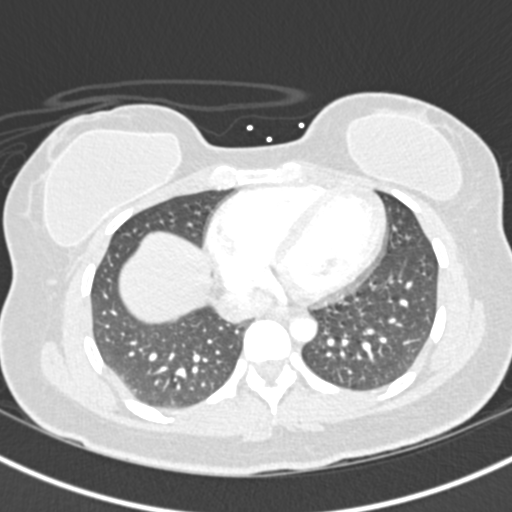
[im 89/256  soft-tissue]
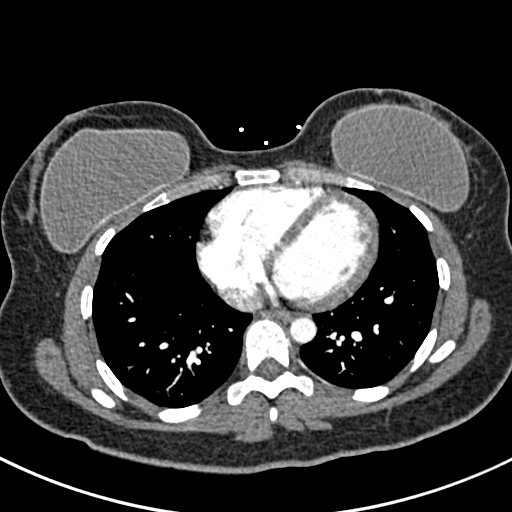
[im 100/256  lung]
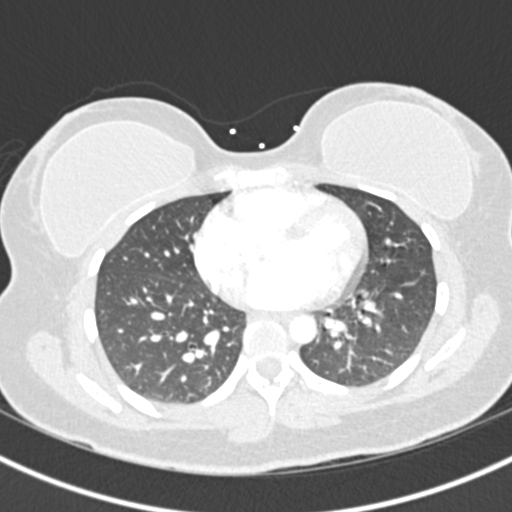
[im 122/256  soft-tissue]
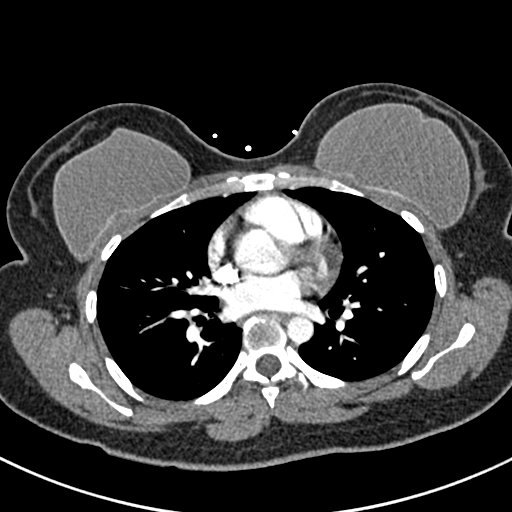
[im 134/256  lung]
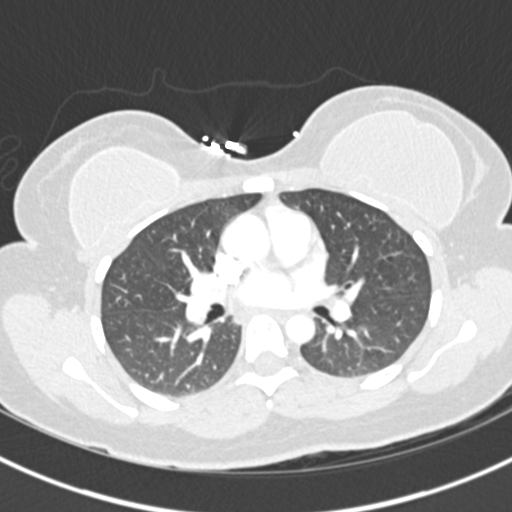
[im 156/256  soft-tissue]
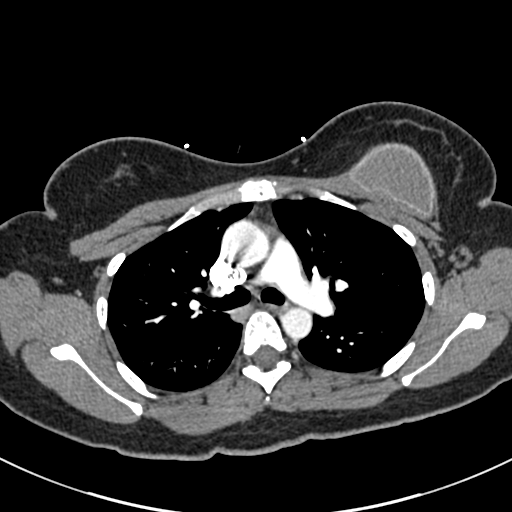
[im 167/256  lung]
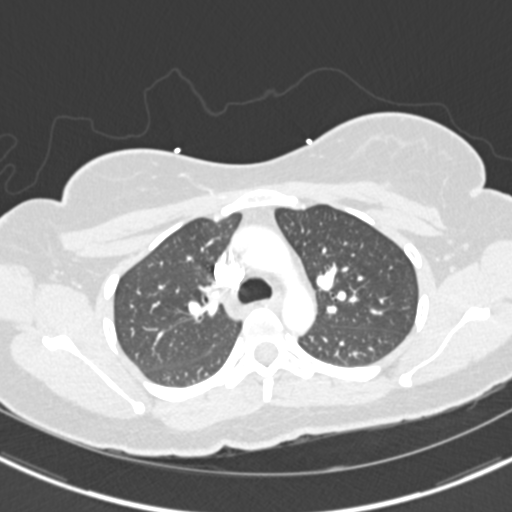
[im 178/256  soft-tissue]
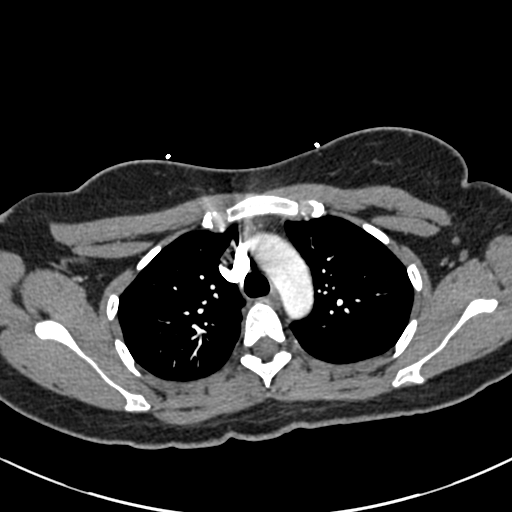
[im 200/256  lung]
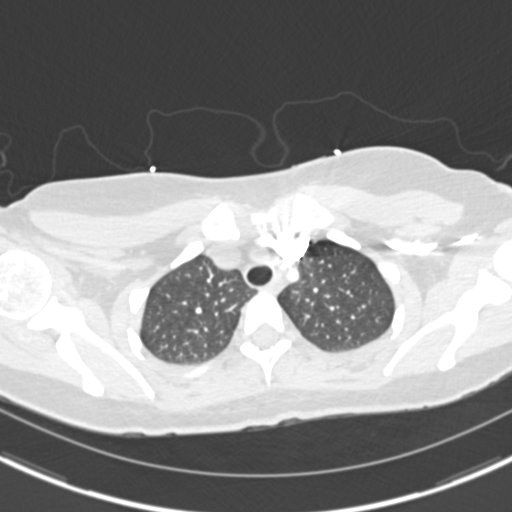
[im 211/256  soft-tissue]
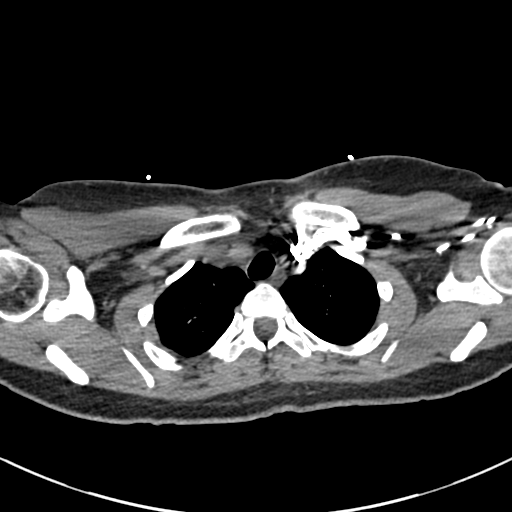
[im 222/256  lung]
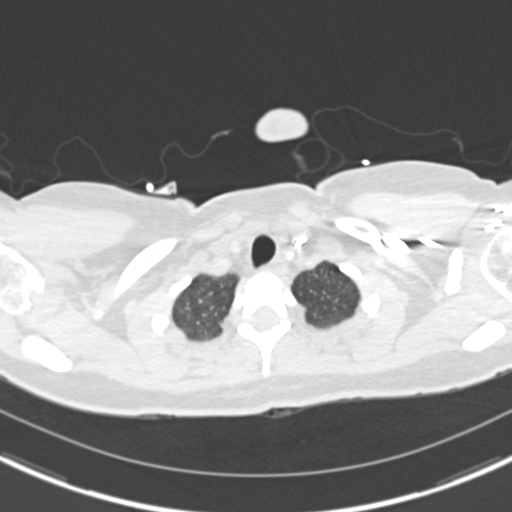
[im 244/256  soft-tissue]
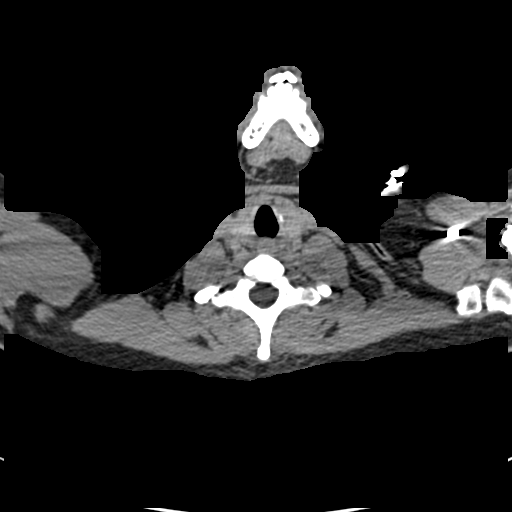

[Series 7: coronal mpr · coronal · 0.52mm/px · 2 of 65 slices shown]
[im 22/65  soft-tissue]
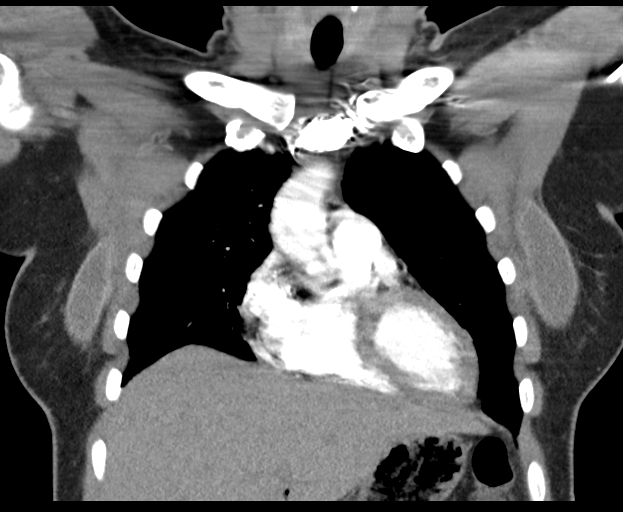
[im 43/65  soft-tissue]
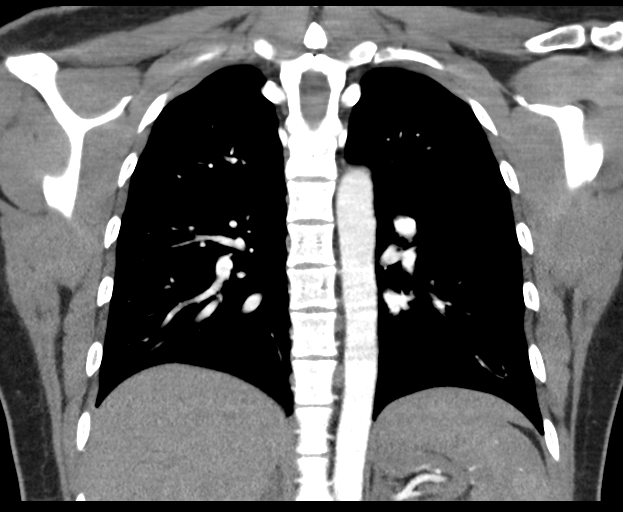

[18 of 46 positions shown; findings below may reference images not displayed]

FINDINGS: Cardiovascular: There is no demonstrable pulmonary embolus. There is
no thoracic aortic aneurysm or dissection. Visualized great vessels
appear normal. Pericardium is not appreciably thickened.

Mediastinum/Nodes: Thyroid appears unremarkable. There is no
appreciable thoracic adenopathy. There is mild air in the distal
esophagus. There does not appear to be thickening of the esophageal
wall.

Lungs/Pleura: Lungs are clear. No pleural effusion or pleural
thickening.

Upper Abdomen: In the visualized upper abdomen, there is slight
reflux of contrast into the inferior vena cava. This contrast does
not extend into the hepatic veins. There are foci of
pneumoperitoneum. Visualized upper abdominal structures otherwise
appear unremarkable.

Musculoskeletal: There are breast implants bilaterally. There are no
blastic or lytic bone lesions.

Review of the MIP images confirms the above findings.
IMPRESSION: 1.  No demonstrable pulmonary embolus.

2.  Lungs clear.

3. Pneumoperitoneum. This pneumoperitoneum is most likely of
postoperative etiology. Patient should be watched closely clinically
in this regard given that this role perforation cannot be entirely
excluded in this circumstance.

4.  No appreciable adenopathy.

5. Modest reflux of contrast into the inferior vena cava.
Significance of this modest reflux uncertain. It potentially could
indicate a degree of increased right heart pressure.

## 2019-08-21 IMAGING — CR DG CHEST 2V
1 series · 2 of 2 positions shown · non-contrast
Comparison: None.

CLINICAL DATA: Chest pain, shortness of breath

EXAM:
CHEST  2 VIEW

[Series 1: w chest pa · 0.14mm/px · 2 of 2 slices shown]
[im 1/2]
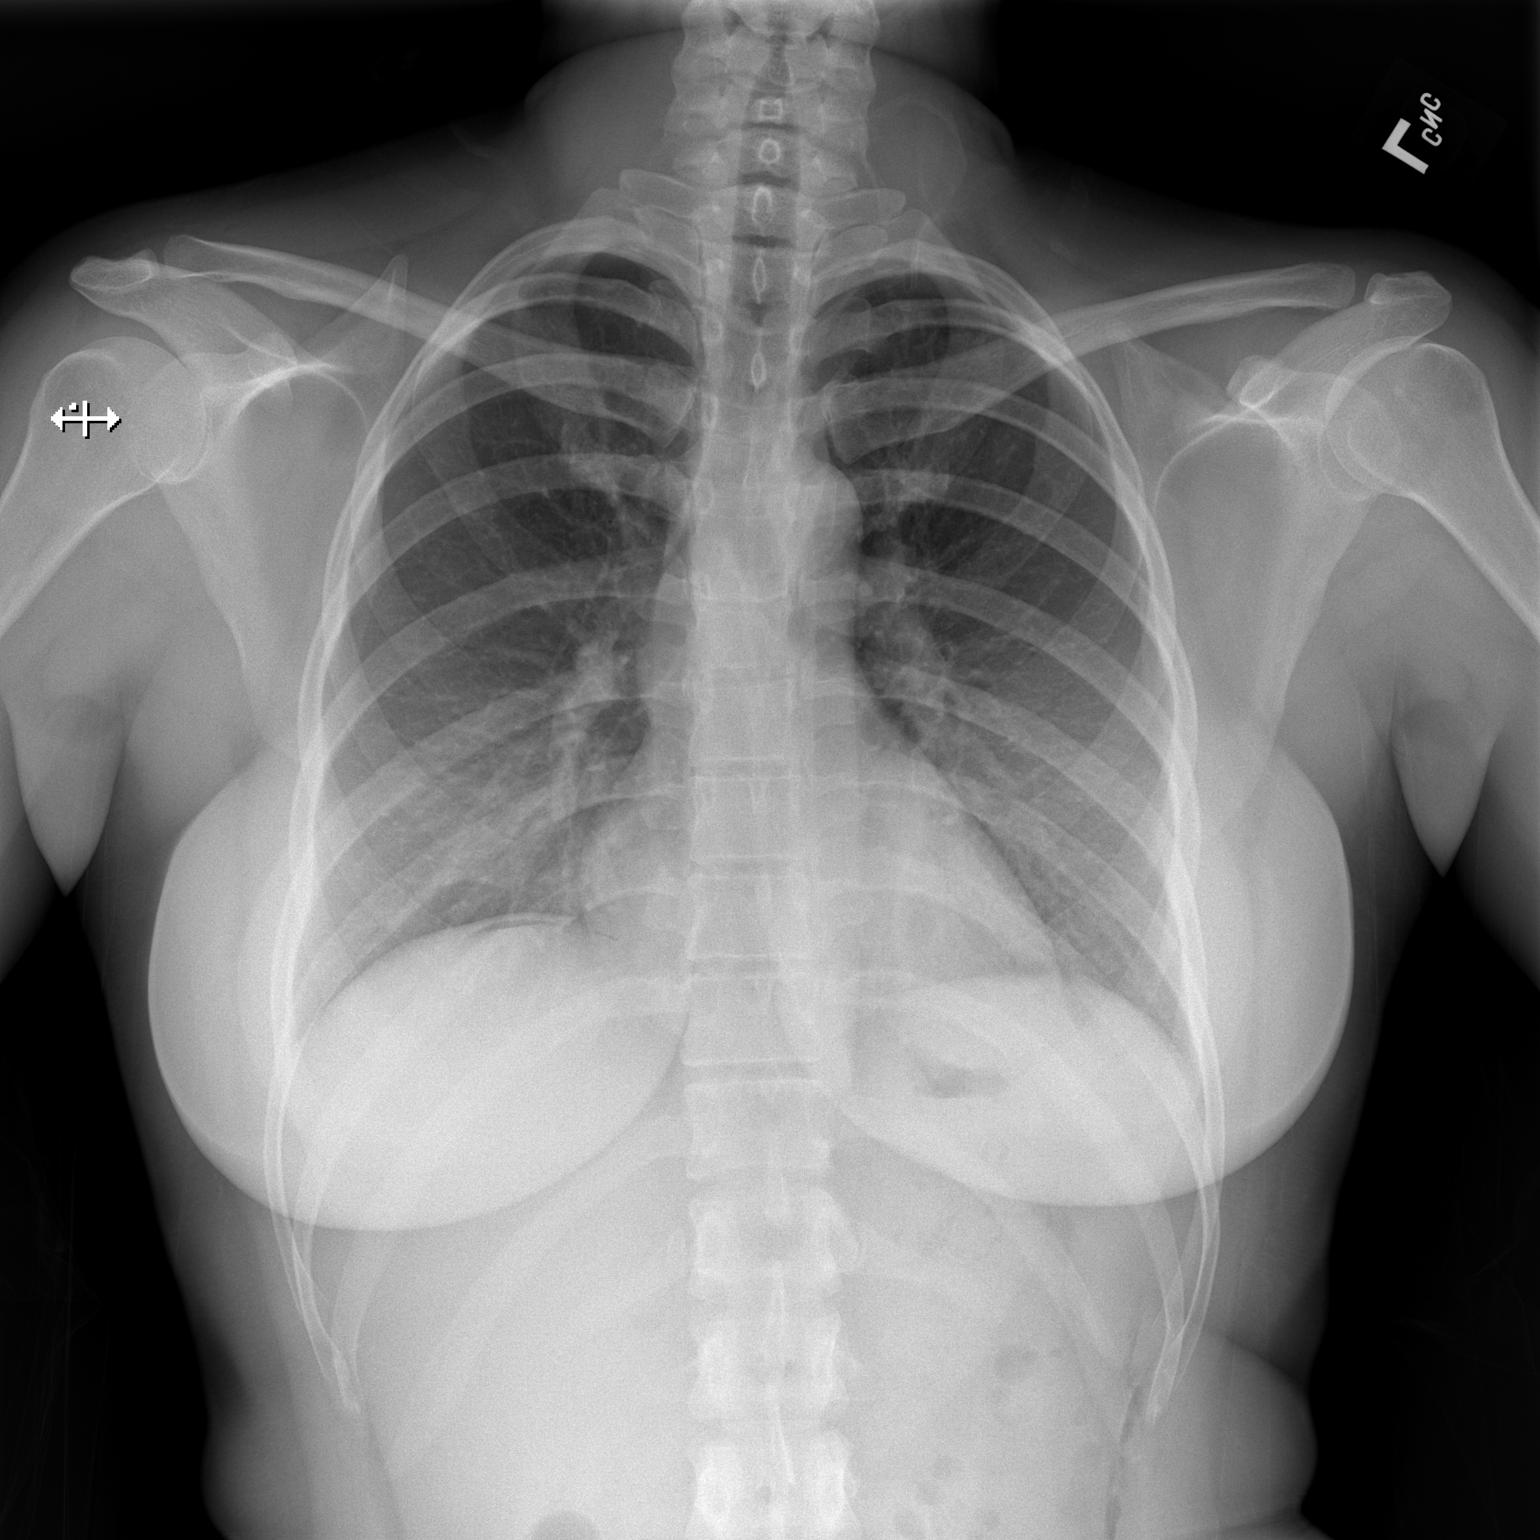
[im 2/2]
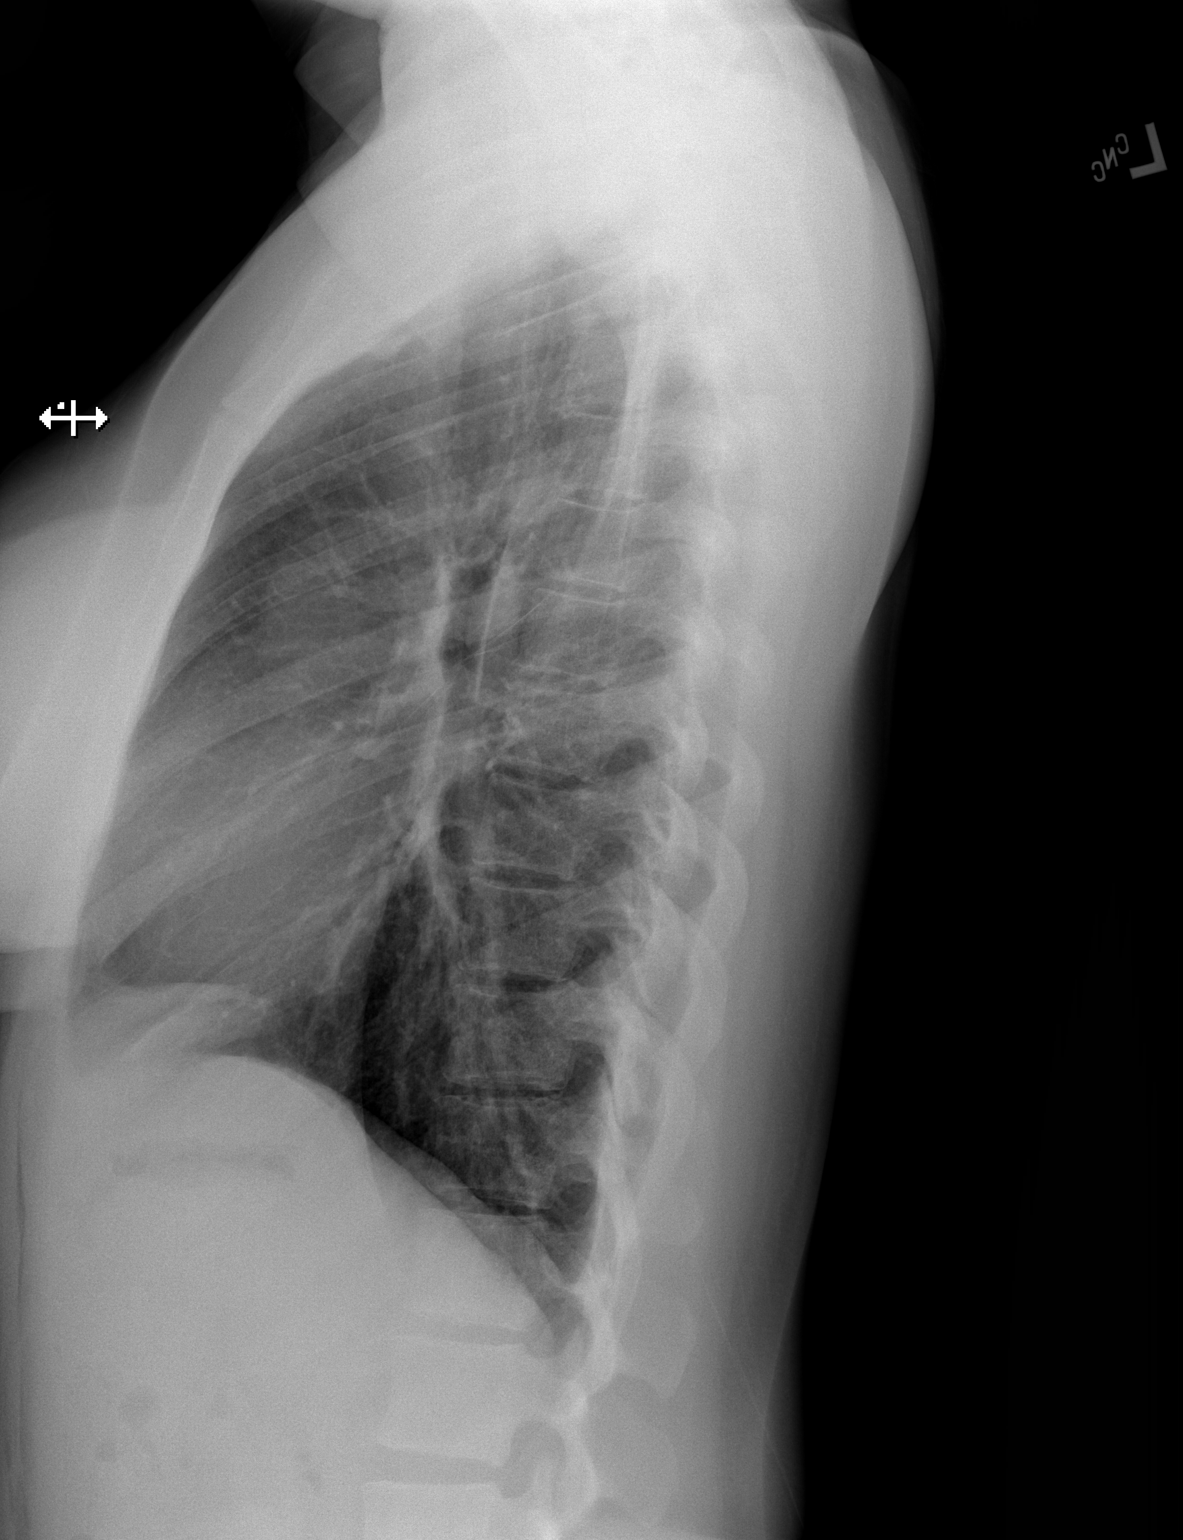

[2 of 2 positions shown; findings below may reference images not displayed]

FINDINGS: There is no focal parenchymal opacity. There is no pleural effusion
or pneumothorax. The heart and mediastinal contours are
unremarkable.

There is a linear lucency under the left hemidiaphragm.

The osseous structures are unremarkable.
IMPRESSION: Linear lucency under the left hemidiaphragm likely postsurgical
given recent tubal ligation on 01/22/2017.

No active cardiopulmonary disease.

These results were called by telephone at the time of interpretation
on 01/26/2017 at [DATE] to Dr. ABLEL DARKO , who verbally
acknowledged these results.

## 2019-12-07 DIAGNOSIS — Z419 Encounter for procedure for purposes other than remedying health state, unspecified: Secondary | ICD-10-CM | POA: Diagnosis not present

## 2020-01-07 DIAGNOSIS — Z419 Encounter for procedure for purposes other than remedying health state, unspecified: Secondary | ICD-10-CM | POA: Diagnosis not present

## 2020-02-07 DIAGNOSIS — Z419 Encounter for procedure for purposes other than remedying health state, unspecified: Secondary | ICD-10-CM | POA: Diagnosis not present

## 2020-03-08 DIAGNOSIS — Z419 Encounter for procedure for purposes other than remedying health state, unspecified: Secondary | ICD-10-CM | POA: Diagnosis not present

## 2020-04-08 DIAGNOSIS — Z419 Encounter for procedure for purposes other than remedying health state, unspecified: Secondary | ICD-10-CM | POA: Diagnosis not present

## 2020-05-08 DIAGNOSIS — Z419 Encounter for procedure for purposes other than remedying health state, unspecified: Secondary | ICD-10-CM | POA: Diagnosis not present

## 2020-06-08 DIAGNOSIS — Z419 Encounter for procedure for purposes other than remedying health state, unspecified: Secondary | ICD-10-CM | POA: Diagnosis not present

## 2020-07-09 DIAGNOSIS — Z419 Encounter for procedure for purposes other than remedying health state, unspecified: Secondary | ICD-10-CM | POA: Diagnosis not present

## 2020-08-06 DIAGNOSIS — Z419 Encounter for procedure for purposes other than remedying health state, unspecified: Secondary | ICD-10-CM | POA: Diagnosis not present

## 2020-09-06 DIAGNOSIS — Z419 Encounter for procedure for purposes other than remedying health state, unspecified: Secondary | ICD-10-CM | POA: Diagnosis not present

## 2020-10-06 DIAGNOSIS — Z419 Encounter for procedure for purposes other than remedying health state, unspecified: Secondary | ICD-10-CM | POA: Diagnosis not present

## 2020-11-06 DIAGNOSIS — Z419 Encounter for procedure for purposes other than remedying health state, unspecified: Secondary | ICD-10-CM | POA: Diagnosis not present

## 2020-12-06 DIAGNOSIS — Z419 Encounter for procedure for purposes other than remedying health state, unspecified: Secondary | ICD-10-CM | POA: Diagnosis not present

## 2021-01-06 DIAGNOSIS — Z419 Encounter for procedure for purposes other than remedying health state, unspecified: Secondary | ICD-10-CM | POA: Diagnosis not present

## 2021-02-06 DIAGNOSIS — Z419 Encounter for procedure for purposes other than remedying health state, unspecified: Secondary | ICD-10-CM | POA: Diagnosis not present

## 2021-03-08 DIAGNOSIS — Z419 Encounter for procedure for purposes other than remedying health state, unspecified: Secondary | ICD-10-CM | POA: Diagnosis not present

## 2021-04-08 DIAGNOSIS — Z419 Encounter for procedure for purposes other than remedying health state, unspecified: Secondary | ICD-10-CM | POA: Diagnosis not present

## 2021-05-08 DIAGNOSIS — Z419 Encounter for procedure for purposes other than remedying health state, unspecified: Secondary | ICD-10-CM | POA: Diagnosis not present

## 2021-06-08 DIAGNOSIS — Z419 Encounter for procedure for purposes other than remedying health state, unspecified: Secondary | ICD-10-CM | POA: Diagnosis not present

## 2021-07-09 DIAGNOSIS — Z419 Encounter for procedure for purposes other than remedying health state, unspecified: Secondary | ICD-10-CM | POA: Diagnosis not present

## 2021-08-06 DIAGNOSIS — Z419 Encounter for procedure for purposes other than remedying health state, unspecified: Secondary | ICD-10-CM | POA: Diagnosis not present

## 2021-09-06 DIAGNOSIS — Z419 Encounter for procedure for purposes other than remedying health state, unspecified: Secondary | ICD-10-CM | POA: Diagnosis not present

## 2021-10-06 DIAGNOSIS — Z419 Encounter for procedure for purposes other than remedying health state, unspecified: Secondary | ICD-10-CM | POA: Diagnosis not present

## 2021-11-06 DIAGNOSIS — Z419 Encounter for procedure for purposes other than remedying health state, unspecified: Secondary | ICD-10-CM | POA: Diagnosis not present

## 2021-12-06 DIAGNOSIS — Z419 Encounter for procedure for purposes other than remedying health state, unspecified: Secondary | ICD-10-CM | POA: Diagnosis not present

## 2022-01-06 DIAGNOSIS — Z419 Encounter for procedure for purposes other than remedying health state, unspecified: Secondary | ICD-10-CM | POA: Diagnosis not present

## 2022-02-06 DIAGNOSIS — Z419 Encounter for procedure for purposes other than remedying health state, unspecified: Secondary | ICD-10-CM | POA: Diagnosis not present

## 2022-03-08 DIAGNOSIS — Z419 Encounter for procedure for purposes other than remedying health state, unspecified: Secondary | ICD-10-CM | POA: Diagnosis not present

## 2022-04-08 DIAGNOSIS — Z419 Encounter for procedure for purposes other than remedying health state, unspecified: Secondary | ICD-10-CM | POA: Diagnosis not present

## 2022-05-08 DIAGNOSIS — Z419 Encounter for procedure for purposes other than remedying health state, unspecified: Secondary | ICD-10-CM | POA: Diagnosis not present

## 2022-06-08 DIAGNOSIS — Z419 Encounter for procedure for purposes other than remedying health state, unspecified: Secondary | ICD-10-CM | POA: Diagnosis not present

## 2022-07-09 DIAGNOSIS — Z419 Encounter for procedure for purposes other than remedying health state, unspecified: Secondary | ICD-10-CM | POA: Diagnosis not present

## 2022-08-07 DIAGNOSIS — Z419 Encounter for procedure for purposes other than remedying health state, unspecified: Secondary | ICD-10-CM | POA: Diagnosis not present

## 2022-09-07 DIAGNOSIS — Z419 Encounter for procedure for purposes other than remedying health state, unspecified: Secondary | ICD-10-CM | POA: Diagnosis not present

## 2022-10-07 DIAGNOSIS — Z419 Encounter for procedure for purposes other than remedying health state, unspecified: Secondary | ICD-10-CM | POA: Diagnosis not present

## 2022-11-07 DIAGNOSIS — Z419 Encounter for procedure for purposes other than remedying health state, unspecified: Secondary | ICD-10-CM | POA: Diagnosis not present

## 2022-12-07 DIAGNOSIS — Z419 Encounter for procedure for purposes other than remedying health state, unspecified: Secondary | ICD-10-CM | POA: Diagnosis not present

## 2023-01-07 DIAGNOSIS — Z419 Encounter for procedure for purposes other than remedying health state, unspecified: Secondary | ICD-10-CM | POA: Diagnosis not present

## 2023-02-07 DIAGNOSIS — Z419 Encounter for procedure for purposes other than remedying health state, unspecified: Secondary | ICD-10-CM | POA: Diagnosis not present
